# Patient Record
Sex: Male | Born: 1990 | Race: White | Hispanic: No | Marital: Single | Smoking: Never smoker
Health system: Southern US, Community
[De-identification: ages and names within clinical notes are randomized; demographics above are authoritative.]

## PROBLEM LIST (undated history)

## (undated) HISTORY — PX: SHOULDER SURGERY: SHX246

---

## 2013-07-19 ENCOUNTER — Emergency Department (HOSPITAL_COMMUNITY): Payer: BC Managed Care – PPO

## 2013-07-19 ENCOUNTER — Emergency Department (HOSPITAL_COMMUNITY)
Admission: EM | Admit: 2013-07-19 | Discharge: 2013-07-19 | Disposition: A | Discharge status: Home / Self Care | Payer: BC Managed Care – PPO | Attending: Emergency Medicine | Admitting: Emergency Medicine

## 2013-07-19 ENCOUNTER — Encounter (HOSPITAL_COMMUNITY): Payer: Self-pay | Admitting: Emergency Medicine

## 2013-07-19 DIAGNOSIS — M24411 Recurrent dislocation, right shoulder: Secondary | ICD-10-CM

## 2013-07-19 DIAGNOSIS — R209 Unspecified disturbances of skin sensation: Secondary | ICD-10-CM | POA: Insufficient documentation

## 2013-07-19 DIAGNOSIS — T50A95A Adverse effect of other bacterial vaccines, initial encounter: Secondary | ICD-10-CM | POA: Insufficient documentation

## 2013-07-19 DIAGNOSIS — S43006A Unspecified dislocation of unspecified shoulder joint, initial encounter: Secondary | ICD-10-CM | POA: Insufficient documentation

## 2013-07-19 DIAGNOSIS — Z79899 Other long term (current) drug therapy: Secondary | ICD-10-CM | POA: Insufficient documentation

## 2013-07-19 DIAGNOSIS — Y9355 Activity, bike riding: Secondary | ICD-10-CM | POA: Insufficient documentation

## 2013-07-19 MED ORDER — OXYCODONE-ACETAMINOPHEN 5-325 MG PO TABS: ORAL_TABLET | ORAL | Status: AC

## 2013-07-19 MED ORDER — PROPOFOL 10 MG/ML IV BOLUS
2.0000 mg/kg | Freq: Once | INTRAVENOUS | Status: AC
  Administered 2013-07-19: 100 mg via INTRAVENOUS
  Filled 2013-07-19: qty 20

## 2013-07-19 MED ORDER — KETOROLAC TROMETHAMINE 30 MG/ML IJ SOLN
30.0000 mg | Freq: Once | INTRAMUSCULAR | Status: AC
  Administered 2013-07-19: 30 mg via INTRAVENOUS
  Filled 2013-07-19: qty 1

## 2013-07-19 MED ORDER — MORPHINE SULFATE 4 MG/ML IJ SOLN
6.0000 mg | Freq: Once | INTRAMUSCULAR | Status: AC
  Administered 2013-07-19: 6 mg via INTRAVENOUS
  Filled 2013-07-19: qty 2

## 2013-07-19 MED ORDER — PROPOFOL 10 MG/ML IV BOLUS: 0.5000 mg/kg | Freq: Once | INTRAVENOUS | Status: DC

## 2013-07-19 NOTE — ED Provider Notes (Signed)
Medical screening examination/treatment/procedure(s) were conducted as a shared visit with non-physician practitioner(s) and myself.  I personally evaluated the patient during the encounter See my note for procedure and sedation details.  Dagmar Hait, MD 07/19/13 2035

## 2013-07-19 NOTE — Progress Notes (Signed)
Orthopedic Tech Progress Note Patient Details:  Kevin Castaneda [birth year] [id number]  Ortho Devices Type of Ortho Device: Sling immobilizer Ortho Device/Splint Location: RUE Ortho Device/Splint Interventions: Ordered;Application   Jennye Moccasin 07/19/2013, 5:15 PM
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
## 2013-07-19 NOTE — ED Provider Notes (Signed)
CSN: [id number]     Arrival date & time 07/19/13  1545 History   First MD Initiated Contact with Patient 07/19/13 1552     Chief Complaint  Patient presents with  . Shoulder Injury   (Consider location/radiation/quality/duration/timing/severity/associated sxs/prior Treatment) HPI  History reviewed. No pertinent past medical history. History reviewed. No pertinent past surgical history. History reviewed. No pertinent family history. History  Substance Use Topics  . Smoking status: Never Smoker   . Smokeless tobacco: Never Used  . Alcohol Use: Yes     Comment: socially    Review of Systems  Allergies  Review of patient's allergies indicates no known allergies.  Home Medications   Current Outpatient Rx  Name  Route  Sig  Dispense  Refill  . amphetamine-dextroamphetamine (ADDERALL XR) 20 MG 24 hr capsule   Oral   Take 20 mg by mouth every morning.         Marland Kitchen ibuprofen (ADVIL,MOTRIN) 200 MG tablet   Oral   Take 800 mg by mouth every 6 (six) hours as needed for pain.         Marland Kitchen oxyCODONE-acetaminophen (PERCOCET/ROXICET) 5-325 MG per tablet      Take 1-2 pills every 4-6 hours as needed for pain.   10 tablet   0    BP 128/84  Pulse 56  Temp(Src) 98.3 F (36.8 C) (Oral)  Resp 26  Ht 5\' 11"  (1.803 m)  Wt 180 lb (81.647 kg)  BMI 25.12 kg/m2  SpO2 100% Physical Exam  ED Course  ORTHOPEDIC INJURY TREATMENT Date/Time: 07/19/2013 8:33 PM Performed by: Dagmar Hait Authorized by: Dagmar Hait Consent: Verbal consent obtained. Risks and benefits: risks, benefits and alternatives were discussed Time out: Immediately prior to procedure a "time out" was called to verify the correct patient, procedure, equipment, support staff and site/side marked as required. Injury location: shoulder Location details: right shoulder Injury type: dislocation Dislocation type: anterior Hill-Sachs deformity: yes (from prior per xray) Chronicity: new Pre-procedure  neurovascular assessment: neurovascularly intact Pre-procedure distal perfusion: normal Pre-procedure neurological function: normal Pre-procedure range of motion: reduced Local anesthesia used: no Patient sedated: yes Sedation type: moderate (conscious) sedation Sedatives: propofol Sedation start date/time: 07/19/2013 5:22 PM Sedation end date/time: 07/19/2013 5:37 PM Vitals: Vital signs were monitored during sedation. Manipulation performed: yes Reduction method: traction and counter traction Reduction successful: yes X-ray confirmed reduction: yes Immobilization: splint Post-procedure neurovascular assessment: post-procedure neurovascularly intact Post-procedure distal perfusion: normal Post-procedure neurological function: normal Post-procedure range of motion: improved Patient tolerance: Patient tolerated the procedure well with no immediate complications.   (including critical care time) Labs Review Labs Reviewed - No data to display Imaging Review Dg Shoulder Right Port  07/19/2013   CLINICAL DATA:  Injury  EXAM: PORTABLE RIGHT SHOULDER - 2+ VIEW  COMPARISON:  06/05/2013  FINDINGS: Negative for fracture or dislocation. Downsloping acromion predispose in the patient to rotator cuff impingement. Hill-Sachs deformity of the proximal humerus related to prior dislocations.  IMPRESSION: Negative for fracture or dislocation.  Hill-Sachs deformity.   Electronically Signed   By: Marlan Palau M.D.   On: 07/19/2013 18:51    EKG Interpretation   None      Procedural sedation Performed by: Dagmar Hait Consent: Verbal consent obtained. Risks and benefits: risks, benefits and alternatives were discussed Required items: required blood products, implants, devices, and special equipment available Patient identity confirmed: arm band and provided demographic data Time out: Immediately prior to procedure a "time out" was called to  verify the correct patient, procedure,  equipment, support staff and site/side marked as required.  Sedation type: moderate (conscious) sedation NPO time confirmed and considedered  Sedatives: Propofol  Physician Time at Bedside: 15 mintues  Vitals: Vital signs were monitored during sedation. Cardiac Monitor, pulse oximeter Patient tolerance: Patient tolerated the procedure well with no immediate complications. Comments: Pt with uneventful recovered. Returned to pre-procedural sedation baseline  MDM   1. Shoulder dislocation, recurrent, right    Medical screening examination/treatment/procedure(s) were conducted as a shared visit with non-physician practitioner(s) and myself.  I personally evaluated the patient during the encounter  For full H&P see Denny Peon O'Malley's note. R shoudler dislocation. NVI distally prior to reduction. Procedural sedation performed for reduction. NVI after reduction, patient given Ortho f/u.      Dagmar Hait, MD 07/19/13 2035

## 2013-11-12 ENCOUNTER — Ambulatory Visit (INDEPENDENT_AMBULATORY_CARE_PROVIDER_SITE_OTHER): Payer: BC Managed Care – PPO | Admitting: Emergency Medicine

## 2013-11-12 VITALS — BP 120/80 | HR 78 | Temp 98.4°F | Resp 16 | Ht 71.5 in | Wt 176.0 lb

## 2013-11-12 DIAGNOSIS — L03119 Cellulitis of unspecified part of limb: Secondary | ICD-10-CM

## 2013-11-12 DIAGNOSIS — L02519 Cutaneous abscess of unspecified hand: Secondary | ICD-10-CM

## 2013-11-12 MED ORDER — AMOXICILLIN-POT CLAVULANATE 875-125 MG PO TABS: 1.0000 | ORAL_TABLET | Freq: Two times a day (BID) | ORAL | Status: AC

## 2013-11-12 NOTE — Patient Instructions (Signed)
Cellulitis Cellulitis is an infection of the skin and the tissue beneath it. The infected area is usually red and tender. Cellulitis occurs most often in the arms and lower legs.  CAUSES  Cellulitis is caused by bacteria that enter the skin through cracks or cuts in the skin. The most common types of bacteria that cause cellulitis are Staphylococcus and Streptococcus. SYMPTOMS   Redness and warmth.  Swelling.  Tenderness or pain.  Fever. DIAGNOSIS  Your caregiver can usually determine what is wrong based on a physical exam. Blood tests may also be done. TREATMENT  Treatment usually involves taking an antibiotic medicine. HOME CARE INSTRUCTIONS   Take your antibiotics as directed. Finish them even if you start to feel better.  Keep the infected arm or leg elevated to reduce swelling.  Apply a warm cloth to the affected area up to 4 times per day to relieve pain.  Only take over-the-counter or prescription medicines for pain, discomfort, or fever as directed by your caregiver.  Keep all follow-up appointments as directed by your caregiver. SEEK MEDICAL CARE IF:   You notice red streaks coming from the infected area.  Your red area gets larger or turns dark in color.  Your bone or joint underneath the infected area becomes painful after the skin has healed.  Your infection returns in the same area or another area.  You notice a swollen bump in the infected area.  You develop new symptoms. SEEK IMMEDIATE MEDICAL CARE IF:   You have a fever.  You feel very sleepy.  You develop vomiting or diarrhea.  You have a general ill feeling (malaise) with muscle aches and pains. MAKE SURE YOU:   Understand these instructions.  Will watch your condition.  Will get help right away if you are not doing well or get worse. Document Released: 06/27/2005 Document Revised: 03/18/2012 Document Reviewed: 12/03/2011 [employer] Patient Information 2014 [employer].  

## 2013-11-12 NOTE — Progress Notes (Signed)
[facility] [address], [place] [id number] [phone]  Date:  11/12/2013   Name:  Kevin Castaneda   DOB:  05/22/1991   MRN:  [id number]  PCP:  No PCP Per Patient    Chief Complaint: Animal Bite   History of Present Illness:  Kevin Castaneda is a 23 y.o. very pleasant male patient who presents with the following:  Bitten by his house cat last night.  Cat is an inside cat and is not immunized against rabies.  It has not been outside in over three months.  Now has swelling, pain and redness of right thumb and left index.  No fever or chills.  No improvement with over the counter medications or other home remedies. Denies other complaint or health concern today.   There are no active problems to display for this patient.   History reviewed. No pertinent past medical history.  Past Surgical History  Procedure Laterality Date  . Shoulder surgery      History  Substance Use Topics  . Smoking status: Never Smoker   . Smokeless tobacco: Never Used  . Alcohol Use: Yes     Comment: socially    History reviewed. No pertinent family history.  No Known Allergies  Medication list has been reviewed and updated.  Current Outpatient Prescriptions on File Prior to Visit  Medication Sig Dispense Refill  . amphetamine-dextroamphetamine (ADDERALL XR) 20 MG 24 hr capsule Take 20 mg by mouth every morning.      Marland Kitchen. ibuprofen (ADVIL,MOTRIN) 200 MG tablet Take 800 mg by mouth every 6 (six) hours as needed for pain.      Marland Kitchen. oxyCODONE-acetaminophen (PERCOCET/ROXICET) 5-325 MG per tablet Take 1-2 pills every 4-6 hours as needed for pain.  10 tablet  0   No current facility-administered medications on file prior to visit.    Review of Systems:  As per HPI, otherwise negative.    Physical Examination: Filed Vitals:   11/12/13 1959  BP: 120/80  Pulse: 78  Temp: 98.4 F (36.9 C)  Resp: 16   Filed Vitals:   11/12/13 1959  Height: 5' 11.5" (1.816 m)   Weight: 176 lb (79.833 kg)   Body mass index is 24.21 kg/(m^2). Ideal Body Weight: Weight in (lb) to have BMI = 25: 181.4   GEN: WDWN, NAD, Non-toxic, Alert & Oriented x 3 HEENT: Atraumatic, Normocephalic.  Ears and Nose: No external deformity. EXTR: No clubbing/cyanosis/edema NEURO: Normal gait.  PSYCH: Normally interactive. Conversant. Not depressed or anxious appearing.  Calm demeanor.  HANDS:  Right thumb and left index have swelling, erythema and tenderness.  Guards.  No lymphangitis.  No evidence of tenosynovitis.    Assessment and Plan: Cat bite augmentin Td Observe cat Follow up in 24 hours if not improved  Signed,  Phillips OdorJeffery Saliha Salts, MD

## 2014-07-08 IMAGING — CR DG SHOULDER 2+V PORT*R*
2 series · 2 of 2 positions shown · non-contrast
Comparison: 06/05/2013

CLINICAL DATA: Injury

EXAM:
PORTABLE RIGHT SHOULDER - 2+ VIEW

[AP (1 of 2)]
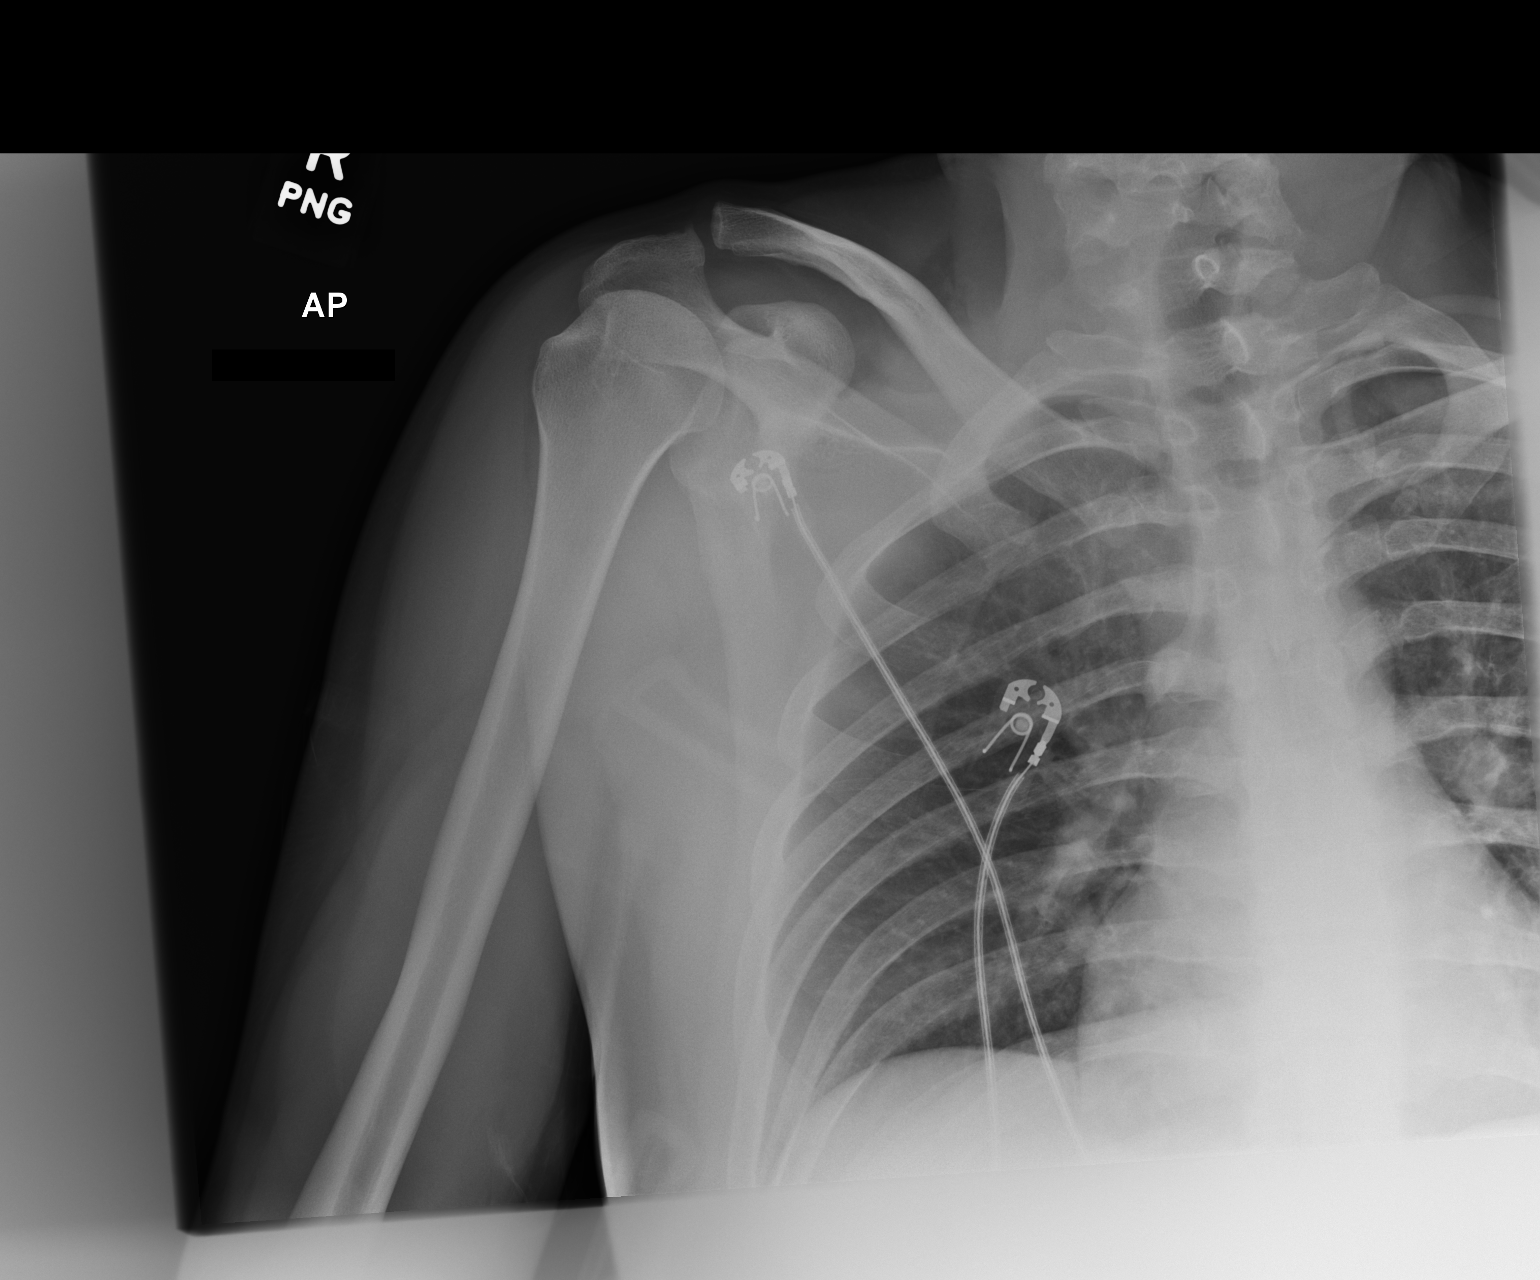

[AP (2 of 2)]
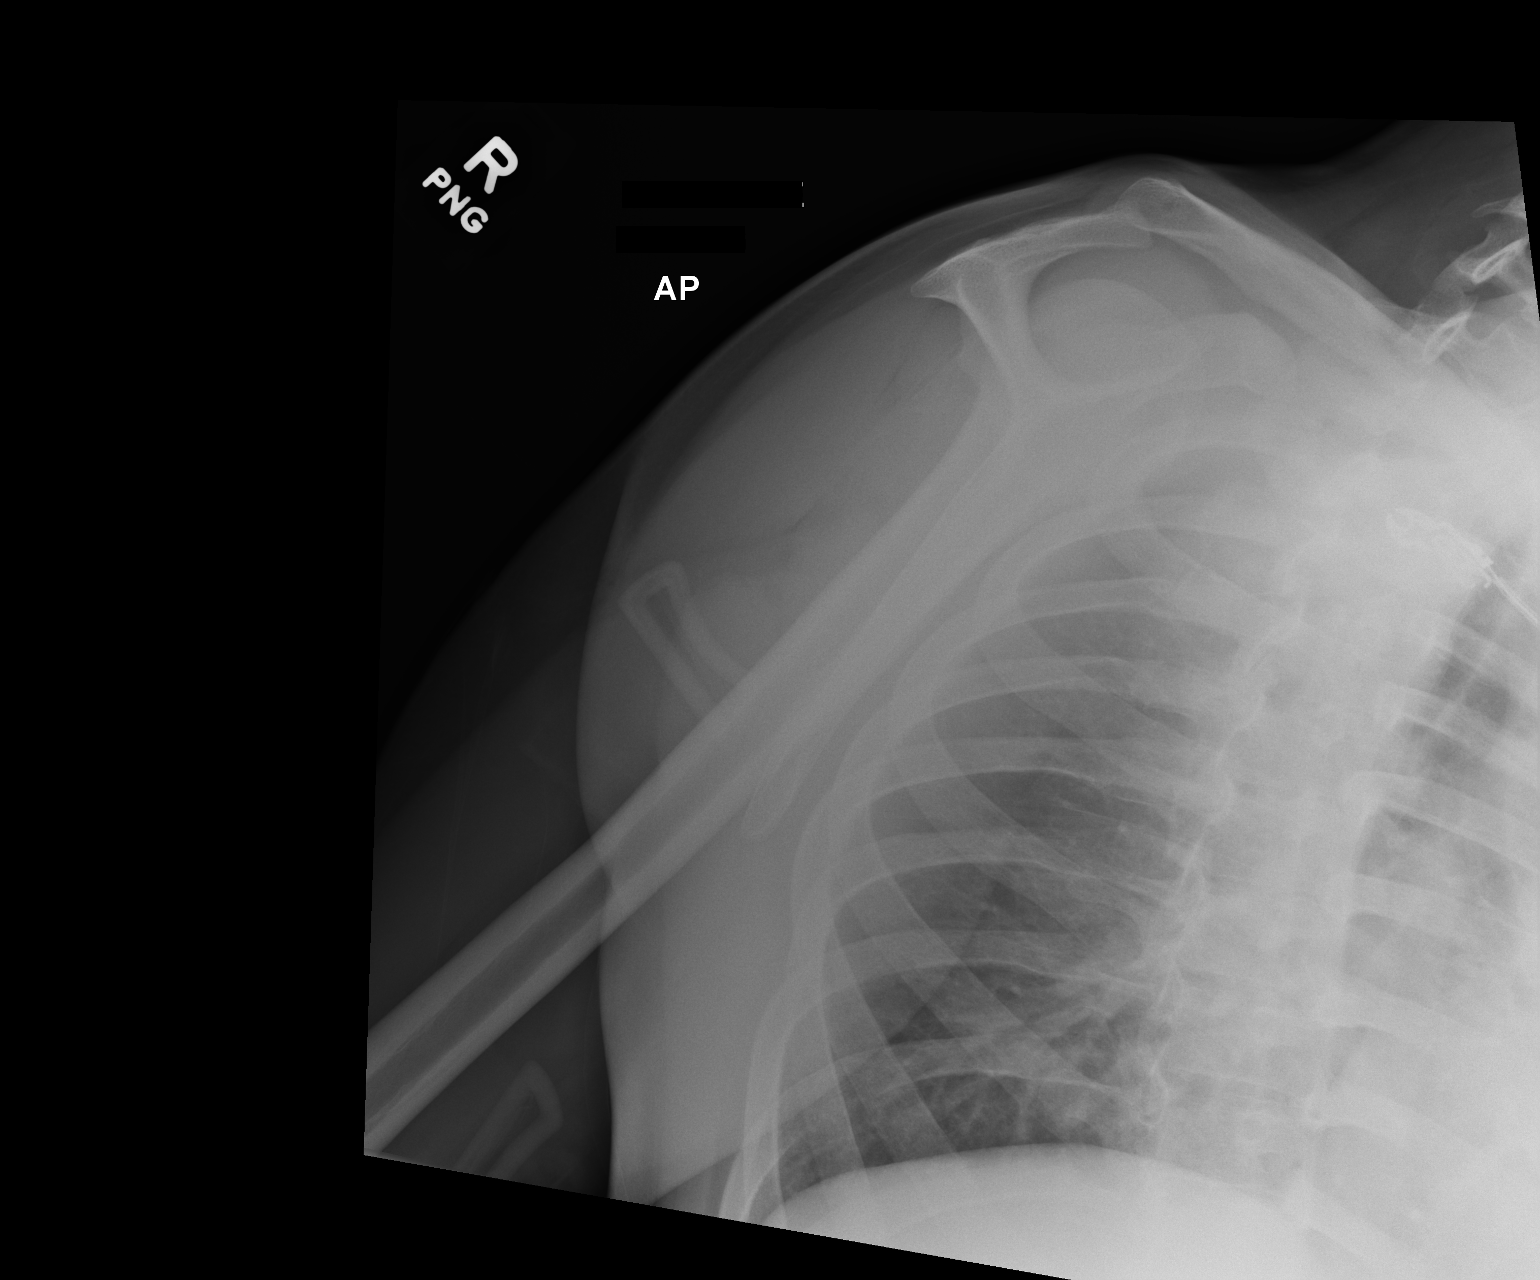

[2 of 2 positions shown; findings below may reference images not displayed]

FINDINGS: Negative for fracture or dislocation. Downsloping acromion
predispose in the patient to rotator cuff impingement. Hill-Sachs
deformity of the proximal humerus related to prior dislocations.
IMPRESSION: Negative for fracture or dislocation.

Hill-Sachs deformity.
